# Patient Record
Sex: Male | Born: 1945 | Race: White | Hispanic: No | Marital: Married | Smoking: Former smoker
Health system: Southern US, Community
[De-identification: ages and names within clinical notes are randomized; demographics above are authoritative.]

## PROBLEM LIST (undated history)

## (undated) DIAGNOSIS — C61 Malignant neoplasm of prostate: Secondary | ICD-10-CM

## (undated) DIAGNOSIS — K219 Gastro-esophageal reflux disease without esophagitis: Secondary | ICD-10-CM

## (undated) DIAGNOSIS — M1990 Unspecified osteoarthritis, unspecified site: Secondary | ICD-10-CM

## (undated) DIAGNOSIS — J189 Pneumonia, unspecified organism: Secondary | ICD-10-CM

## (undated) HISTORY — PX: CHOLECYSTECTOMY: SHX55

## (undated) HISTORY — PX: HERNIA REPAIR: SHX51

---

## 2005-11-21 ENCOUNTER — Emergency Department (HOSPITAL_COMMUNITY): Admission: EM | Admit: 2005-11-21 | Discharge: 2005-11-21 | Payer: Self-pay | Admitting: Emergency Medicine

## 2005-11-24 ENCOUNTER — Emergency Department (HOSPITAL_COMMUNITY): Admission: EM | Admit: 2005-11-24 | Discharge: 2005-11-24 | Payer: Self-pay | Admitting: Emergency Medicine

## 2005-11-28 ENCOUNTER — Encounter (HOSPITAL_COMMUNITY): Admission: RE | Admit: 2005-11-28 | Discharge: 2006-02-26 | Payer: Self-pay | Admitting: Emergency Medicine

## 2014-02-17 ENCOUNTER — Other Ambulatory Visit: Payer: Self-pay | Admitting: Family Medicine

## 2014-02-17 DIAGNOSIS — Z87891 Personal history of nicotine dependence: Secondary | ICD-10-CM

## 2014-03-09 ENCOUNTER — Ambulatory Visit
Admission: RE | Admit: 2014-03-09 | Discharge: 2014-03-09 | Disposition: A | Discharge status: Home / Self Care | Payer: Medicare PPO | Source: Ambulatory Visit | Attending: Family Medicine | Admitting: Family Medicine

## 2014-03-09 DIAGNOSIS — Z87891 Personal history of nicotine dependence: Secondary | ICD-10-CM

## 2014-04-22 ENCOUNTER — Ambulatory Visit
Admission: RE | Admit: 2014-04-22 | Discharge: 2014-04-22 | Disposition: A | Discharge status: Home / Self Care | Payer: Medicare PPO | Source: Ambulatory Visit | Attending: Family Medicine | Admitting: Family Medicine

## 2014-04-22 ENCOUNTER — Other Ambulatory Visit: Payer: Self-pay | Admitting: Family Medicine

## 2014-04-22 DIAGNOSIS — M25551 Pain in right hip: Secondary | ICD-10-CM

## 2015-02-22 DIAGNOSIS — Z23 Encounter for immunization: Secondary | ICD-10-CM | POA: Diagnosis not present

## 2015-02-22 DIAGNOSIS — Z131 Encounter for screening for diabetes mellitus: Secondary | ICD-10-CM | POA: Diagnosis not present

## 2015-02-22 DIAGNOSIS — Z0000 Encounter for general adult medical examination without abnormal findings: Secondary | ICD-10-CM | POA: Diagnosis not present

## 2015-02-22 DIAGNOSIS — Z125 Encounter for screening for malignant neoplasm of prostate: Secondary | ICD-10-CM | POA: Diagnosis not present

## 2015-04-05 DIAGNOSIS — H52223 Regular astigmatism, bilateral: Secondary | ICD-10-CM | POA: Diagnosis not present

## 2015-04-05 DIAGNOSIS — H524 Presbyopia: Secondary | ICD-10-CM | POA: Diagnosis not present

## 2015-04-05 DIAGNOSIS — H2513 Age-related nuclear cataract, bilateral: Secondary | ICD-10-CM | POA: Diagnosis not present

## 2015-06-02 DIAGNOSIS — L821 Other seborrheic keratosis: Secondary | ICD-10-CM | POA: Diagnosis not present

## 2015-06-02 DIAGNOSIS — D2239 Melanocytic nevi of other parts of face: Secondary | ICD-10-CM | POA: Diagnosis not present

## 2015-06-02 DIAGNOSIS — D225 Melanocytic nevi of trunk: Secondary | ICD-10-CM | POA: Diagnosis not present

## 2015-06-02 DIAGNOSIS — Z872 Personal history of diseases of the skin and subcutaneous tissue: Secondary | ICD-10-CM | POA: Diagnosis not present

## 2015-06-02 DIAGNOSIS — L570 Actinic keratosis: Secondary | ICD-10-CM | POA: Diagnosis not present

## 2015-06-02 DIAGNOSIS — D1801 Hemangioma of skin and subcutaneous tissue: Secondary | ICD-10-CM | POA: Diagnosis not present

## 2015-06-02 DIAGNOSIS — L814 Other melanin hyperpigmentation: Secondary | ICD-10-CM | POA: Diagnosis not present

## 2016-09-25 IMAGING — CR DG HIP COMPLETE 2+V*R*
2 series · 2 of 2 positions shown · non-contrast
Comparison: None.

CLINICAL DATA: Right hip pain over the last month, no trauma, the
patient is a runner

EXAM:
RIGHT HIP - COMPLETE 2+ VIEW

[t hip ap right]
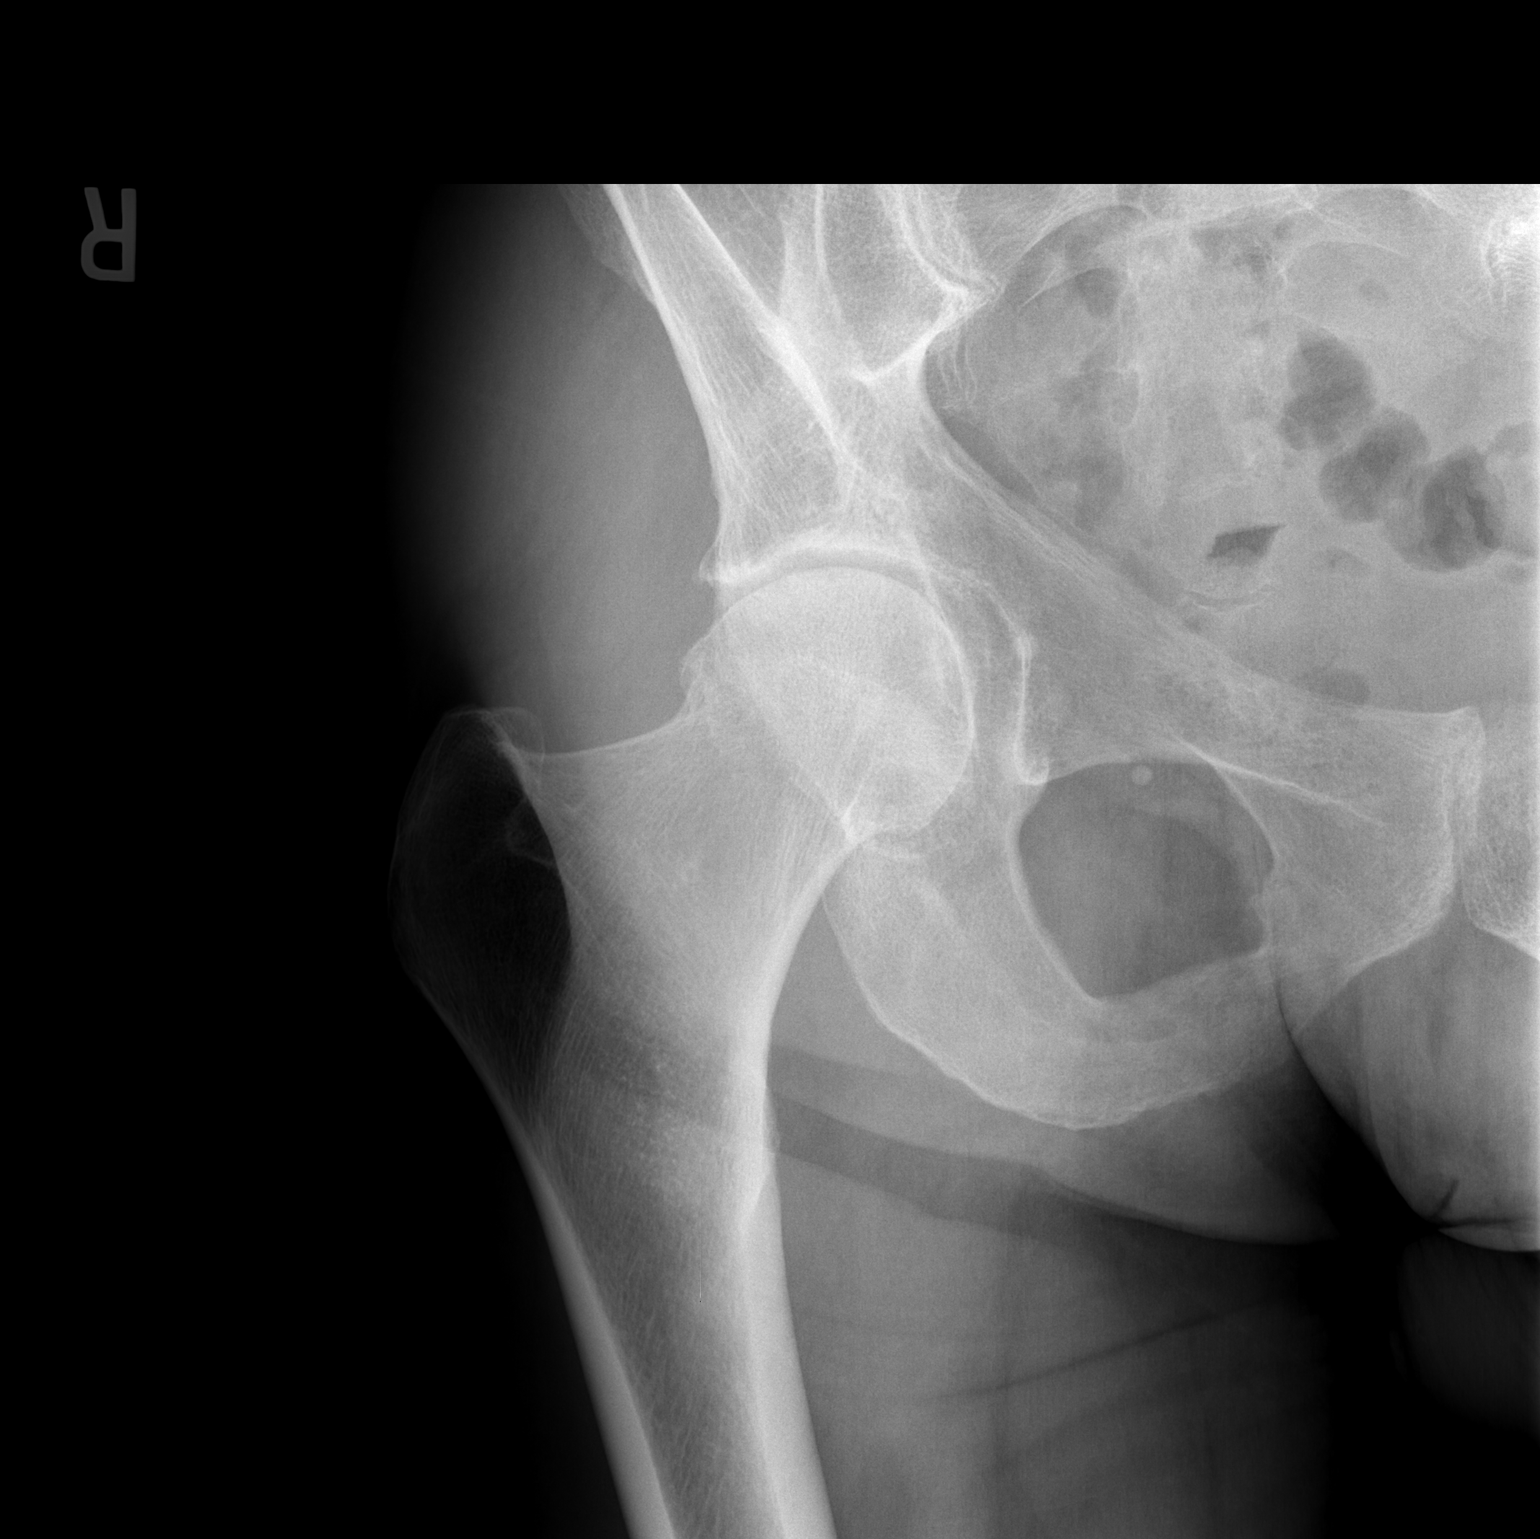

[t hip frog leg right]
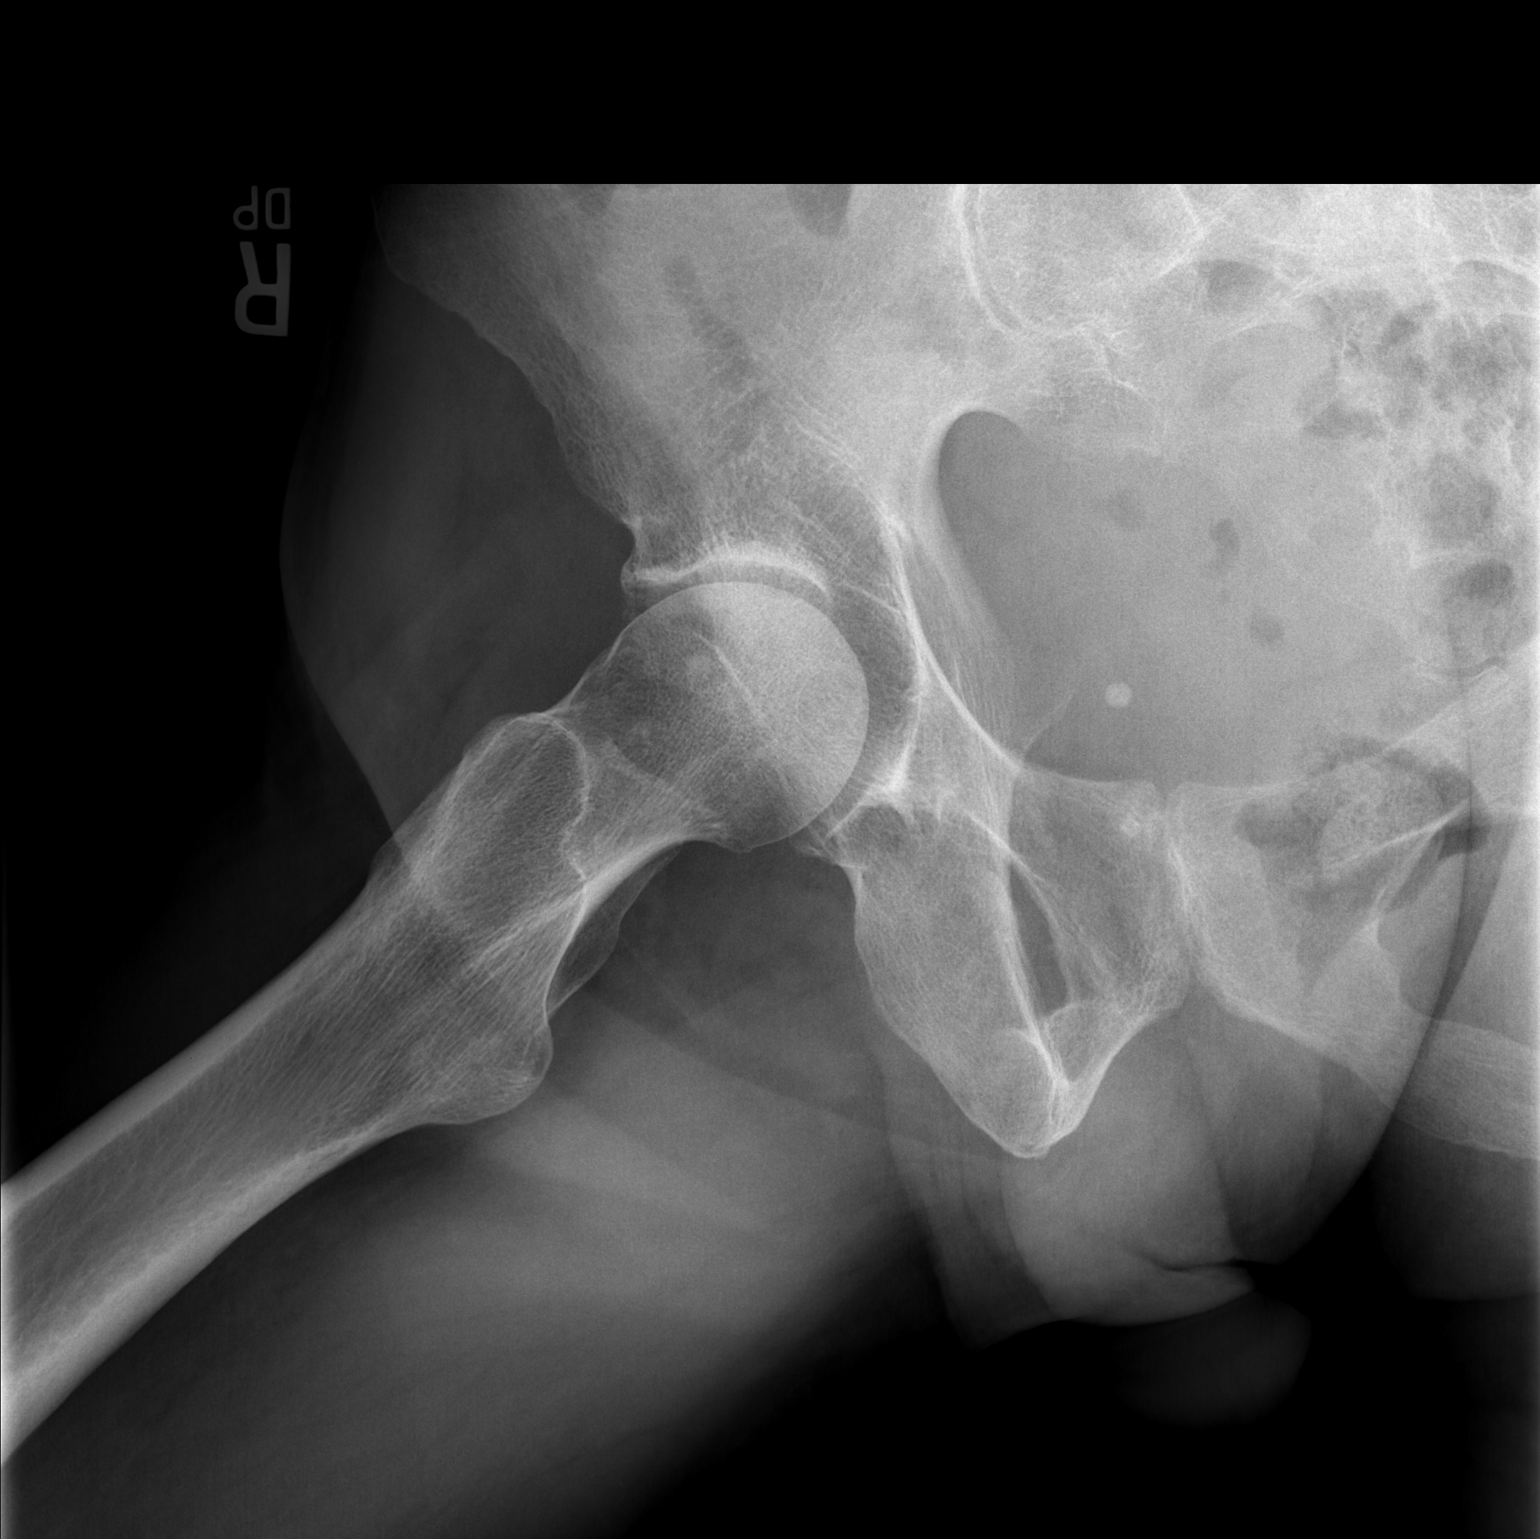

[2 of 2 positions shown; findings below may reference images not displayed]

FINDINGS: There is mild degenerative joint disease of the right hip with some
loss of joint space and sclerosis with minimal spurring. No acute
fracture is seen. The right pelvic ramus is intact. The right SI
joint appears corticated.
IMPRESSION: Mild degenerative joint disease of the right hip.

## 2017-12-18 ENCOUNTER — Other Ambulatory Visit: Payer: Self-pay | Admitting: Urology

## 2017-12-18 DIAGNOSIS — C61 Malignant neoplasm of prostate: Secondary | ICD-10-CM

## 2017-12-31 ENCOUNTER — Encounter (HOSPITAL_COMMUNITY)
Admission: RE | Admit: 2017-12-31 | Discharge: 2017-12-31 | Disposition: A | Discharge status: Home / Self Care | Payer: Medicare Other | Source: Ambulatory Visit | Attending: Urology | Admitting: Urology

## 2017-12-31 ENCOUNTER — Ambulatory Visit (HOSPITAL_COMMUNITY)
Admission: RE | Admit: 2017-12-31 | Discharge: 2017-12-31 | Disposition: A | Discharge status: Home / Self Care | Payer: Medicare Other | Source: Ambulatory Visit | Attending: Urology | Admitting: Urology

## 2017-12-31 ENCOUNTER — Encounter (HOSPITAL_COMMUNITY): Payer: Self-pay

## 2017-12-31 DIAGNOSIS — C61 Malignant neoplasm of prostate: Secondary | ICD-10-CM

## 2017-12-31 MED ORDER — FLUDEOXYGLUCOSE F - 18 (FDG) INJECTION
21.9000 | Freq: Once | INTRAVENOUS | Status: AC
  Administered 2017-12-31: 21.9 via INTRAVENOUS

## 2018-01-10 ENCOUNTER — Other Ambulatory Visit: Payer: Self-pay | Admitting: Urology

## 2018-02-18 NOTE — Patient Instructions (Addendum)
Casey Burke  02/18/2018   Your procedure is scheduled on: 02-27-18    Report to [facility] Main  Entrance    Report to admitting at 5:30AM    Call this number if you have problems the morning of surgery [phone]    Remember: Do not eat food After Midnight on Tuesday 02-25-18. Drink plenty of clear liquids (see menu below) all day Wednesday 02-26-18 and follow all bowel prep orders provided by your surgeon. Nothing by mouth after midnight on Wednesday !   PER YOUR SURGEONS ORDERS. YOU MUST DO A FLEETS ENEMA THE NIGHT BEFORE SURGERY!   CLEAR LIQUID DIET   Foods Allowed                                                                     Foods Excluded  Coffee and tea, regular and decaf                             liquids that you cannot  Plain Jell-O in any flavor                                             see through such as: Fruit ices (not with fruit pulp)                                     milk, soups, orange juice  Iced Popsicles                                    All solid food Carbonated beverages, regular and diet                                    Cranberry, grape and apple juices Sports drinks like Gatorade Lightly seasoned clear broth or consume(fat free) Sugar, honey syrup  Sample Menu Breakfast                                Lunch                                     Supper Cranberry juice                    Beef broth                            Chicken broth Jell-O                                     Grape  juice                           Apple juice Coffee or tea                        Jell-O                                      Popsicle                                                Coffee or tea                        Coffee or tea  ____________________________________________________________________       Take these medicines the morning of surgery with A SIP OF WATER:                                 You may not have any metal on  your body including hair pins and              piercings  Do not wear jewelry, make-up, lotions, powders or perfumes, deodorant                   Men may shave face and neck.   Do not bring valuables to the hospital. [place].  Contacts, dentures or bridgework may not be worn into surgery.  Leave suitcase in the car. After surgery it may be brought to your room.                  Please read over the following fact sheets you were given: _____________________________________________________________________             [facility] - Preparing for Surgery Before surgery, you can play an important role.  Because skin is not sterile, your skin needs to be as free of germs as possible.  You can reduce the number of germs on your skin by washing with CHG (chlorahexidine gluconate) soap before surgery.  CHG is an antiseptic cleaner which kills germs and bonds with the skin to continue killing germs even after washing. Please DO NOT use if you have an allergy to CHG or antibacterial soaps.  If your skin becomes reddened/irritated stop using the CHG and inform your nurse when you arrive at Short Stay. Do not shave (including legs and underarms) for at least 48 hours prior to the first CHG shower.  You may shave your face/neck. Please follow these instructions carefully:  1.  Shower with CHG Soap the night before surgery and the  morning of Surgery.  2.  If you choose to wash your hair, wash your hair first as usual with your  normal  shampoo.  3.  After you shampoo, rinse your hair and body thoroughly to remove the  shampoo.                           4.  Use CHG as you would any other liquid soap.  You can apply chg directly  to the skin and wash                       Gently with a scrungie or clean washcloth.  5.  Apply the CHG Soap to your body ONLY FROM THE NECK DOWN.   Do not use on face/ open                           Wound or open sores. Avoid  contact with eyes, ears mouth and genitals (private parts).                       Wash face,  Genitals (private parts) with your normal soap.             6.  Wash thoroughly, paying special attention to the area where your surgery  will be performed.  7.  Thoroughly rinse your body with warm water from the neck down.  8.  DO NOT shower/wash with your normal soap after using and rinsing off  the CHG Soap.                9.  Pat yourself dry with a clean towel.            10.  Wear clean pajamas.            11.  Place clean sheets on your bed the night of your first shower and do not  sleep with pets. Day of Surgery : Do not apply any lotions/deodorants the morning of surgery.  Please wear clean clothes to the hospital/surgery center.  FAILURE TO FOLLOW THESE INSTRUCTIONS MAY RESULT IN THE CANCELLATION OF YOUR SURGERY PATIENT SIGNATURE_________________________________  NURSE SIGNATURE__________________________________  ________________________________________________________________________   Casey Burke  An incentive spirometer is a tool that can help keep your lungs clear and active. This tool measures how well you are filling your lungs with each breath. Taking long deep breaths may help reverse or decrease the chance of developing breathing (pulmonary) problems (especially infection) following:  A long period of time when you are unable to move or be active. BEFORE THE PROCEDURE   If the spirometer includes an indicator to show your best effort, your nurse or respiratory therapist will set it to a desired goal.  If possible, sit up straight or lean slightly forward. Try not to slouch.  Hold the incentive spirometer in an upright position. INSTRUCTIONS FOR USE  1. Sit on the edge of your bed if possible, or sit up as far as you can in bed or on a chair. 2. Hold the incentive spirometer in an upright position. 3. Breathe out normally. 4. Place the mouthpiece in your mouth  and seal your lips tightly around it. 5. Breathe in slowly and as deeply as possible, raising the piston or the ball toward the top of the column. 6. Hold your breath for 3-5 seconds or for as long as possible. Allow the piston or ball to fall to the bottom of the column. 7. Remove the mouthpiece from your mouth and breathe out normally. 8. Rest for a few seconds and repeat Steps 1 through 7 at least 10 times every 1-2 hours when you are awake. Take your time and take a few normal breaths between deep breaths. 9. The spirometer may include an indicator to show your  best effort. Use the indicator as a goal to work toward during each repetition. 10. After each set of 10 deep breaths, practice coughing to be sure your lungs are clear. If you have an incision (the cut made at the time of surgery), support your incision when coughing by placing a pillow or rolled up towels firmly against it. Once you are able to get out of bed, walk around indoors and cough well. You may stop using the incentive spirometer when instructed by your caregiver.  RISKS AND COMPLICATIONS  Take your time so you do not get dizzy or light-headed.  If you are in pain, you may need to take or ask for pain medication before doing incentive spirometry. It is harder to take a deep breath if you are having pain. AFTER USE  Rest and breathe slowly and easily.  It can be helpful to keep track of a log of your progress. Your caregiver can provide you with a simple table to help with this. If you are using the spirometer at home, follow these instructions: [place] IF:   You are having difficultly using the spirometer.  You have trouble using the spirometer as often as instructed.  Your pain medication is not giving enough relief while using the spirometer.  You develop fever of 100.5 F (38.1 C) or higher. SEEK IMMEDIATE MEDICAL CARE IF:   You cough up bloody sputum that had not been present before.  You develop  fever of 102 F (38.9 C) or greater.  You develop worsening pain at or near the incision site. MAKE SURE YOU:   Understand these instructions.  Will watch your condition.  Will get help right away if you are not doing well or get worse. Document Released: 10/22/2006 Document Revised: 09/03/2011 Document Reviewed: 12/23/2006 Exit[employer] Patient Information 2014 [employer], [place].   ________________________________________________________________________  WHAT IS A BLOOD TRANSFUSION? Blood Transfusion Information  A transfusion is the replacement of blood or some of its parts. Blood is made up of multiple cells which provide different functions.  Red blood cells carry oxygen and are used for blood loss replacement.  White blood cells fight against infection.  Platelets control bleeding.  Plasma helps clot blood.  Other blood products are available for specialized needs, such as hemophilia or other clotting disorders. BEFORE THE TRANSFUSION  Who gives blood for transfusions?   Healthy volunteers who are fully evaluated to make sure their blood is safe. This is blood bank blood. Transfusion therapy is the safest it has ever been in the practice of medicine. Before blood is taken from a donor, a complete history is taken to make sure that person has no history of diseases nor engages in risky social behavior (examples are intravenous drug use or sexual activity with multiple partners). The donor's travel history is screened to minimize risk of transmitting infections, such as malaria. The donated blood is tested for signs of infectious diseases, such as HIV and hepatitis. The blood is then tested to be sure it is compatible with you in order to minimize the chance of a transfusion reaction. If you or a relative donates blood, this is often done in anticipation of surgery and is not appropriate for emergency situations. It takes many days to process the donated blood. RISKS AND  COMPLICATIONS Although transfusion therapy is very safe and saves many lives, the main dangers of transfusion include:   Getting an infectious disease.  Developing a transfusion reaction. This is an allergic reaction to something  in the blood you were given. Every precaution is taken to prevent this. The decision to have a blood transfusion has been considered carefully by your caregiver before blood is given. Blood is not given unless the benefits outweigh the risks. AFTER THE TRANSFUSION  Right after receiving a blood transfusion, you will usually feel much better and more energetic. This is especially true if your red blood cells have gotten low (anemic). The transfusion raises the level of the red blood cells which carry oxygen, and this usually causes an energy increase.  The nurse administering the transfusion will monitor you carefully for complications. HOME CARE INSTRUCTIONS  No special instructions are needed after a transfusion. You may find your energy is better. Speak with your caregiver about any limitations on activity for underlying diseases you may have. SEEK MEDICAL CARE IF:   Your condition is not improving after your transfusion.  You develop redness or irritation at the intravenous (IV) site. SEEK IMMEDIATE MEDICAL CARE IF:  Any of the following symptoms occur over the next 12 hours:  Shaking chills.  You have a temperature by mouth above 102 F (38.9 C), not controlled by medicine.  Chest, back, or muscle pain.  People around you feel you are not acting correctly or are confused.  Shortness of breath or difficulty breathing.  Dizziness and fainting.  You get a rash or develop hives.  You have a decrease in urine output.  Your urine turns a dark color or changes to pink, red, or brown. Any of the following symptoms occur over the next 10 days:  You have a temperature by mouth above 102 F (38.9 C), not controlled by medicine.  Shortness of  breath.  Weakness after normal activity.  The white part of the eye turns yellow (jaundice).  You have a decrease in the amount of urine or are urinating less often.  Your urine turns a dark color or changes to pink, red, or brown. Document Released: 06/08/2000 Document Revised: 09/03/2011 Document Reviewed: 01/26/2008 [facility] Patient Information 2014 [place], [place].  _______________________________________________________________________

## 2018-02-20 ENCOUNTER — Other Ambulatory Visit: Payer: Self-pay

## 2018-02-20 ENCOUNTER — Encounter (HOSPITAL_COMMUNITY)
Admission: RE | Admit: 2018-02-20 | Discharge: 2018-02-20 | Disposition: A | Discharge status: Home / Self Care | Payer: Medicare Other | Source: Ambulatory Visit | Attending: Urology | Admitting: Urology

## 2018-02-20 ENCOUNTER — Encounter (HOSPITAL_COMMUNITY): Payer: Self-pay

## 2018-02-20 DIAGNOSIS — Z01818 Encounter for other preprocedural examination: Secondary | ICD-10-CM | POA: Insufficient documentation

## 2018-02-20 DIAGNOSIS — C61 Malignant neoplasm of prostate: Secondary | ICD-10-CM | POA: Insufficient documentation

## 2018-02-20 HISTORY — DX: Gastro-esophageal reflux disease without esophagitis: K21.9

## 2018-02-20 HISTORY — DX: Pneumonia, unspecified organism: J18.9

## 2018-02-20 HISTORY — DX: Unspecified osteoarthritis, unspecified site: M19.90

## 2018-02-20 HISTORY — DX: Malignant neoplasm of prostate: C61

## 2018-02-20 LAB — BASIC METABOLIC PANEL
Anion gap: 5 (ref 5–15)
BUN: 15 mg/dL (ref 8–23)
CO2: 30 mmol/L (ref 22–32)
Calcium: 9.1 mg/dL (ref 8.9–10.3)
Chloride: 101 mmol/L (ref 98–111)
Creatinine, Ser: 0.86 mg/dL (ref 0.61–1.24)
GFR calc Af Amer: >60 mL/min (ref >60)
GLUCOSE: 90 mg/dL (ref 70–99)
Potassium: 4.4 mmol/L (ref 3.5–5.1)
Sodium: 136 mmol/L (ref 135–145)

## 2018-02-20 LAB — CBC
HEMATOCRIT: 43.9 % (ref 39.0–52.0)
Hemoglobin: 15.2 g/dL (ref 13.0–17.0)
MCH: 32.5 pg (ref 26.0–34.0)
MCHC: 34.6 g/dL (ref 30.0–36.0)
MCV: 93.8 fL (ref 78.0–100.0)
Platelets: 229 10*3/uL (ref 150–400)
RBC: 4.68 MIL/uL (ref 4.22–5.81)
RDW: 13.3 % (ref 11.5–15.5)
WBC: 4.5 10*3/uL (ref 4.0–10.5)

## 2018-02-20 LAB — ABO/RH: ABO/RH(D): B POS

## 2018-02-26 NOTE — H&P (Signed)
CC/HPI: CC: Prostate Cancer    Casey Burke is a 72 year old gentleman who was noted to have an elevated PSA of 3.03 and abnormal digital rectal exam with nodularity of the right mid/apex. He underwent a TRUS biopsy by Dr. 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   Family history: Father (diagnosed in early 82s and treated with XRT)   Imaging studies:  CT pelvis (12/31/17): Negative for metastatic disease.  Bone scan (12/31/17): Negative for metastatic disease.   PMH: He has a history of GERD.  PSH: Laparoscopic cholecystectomy.   TNM stage: cT2b N0 M0 (R mid/apex - confined)  PSA: 3.03  Gleason score: 4+3=7  Biopsy (12/10/17): 8/12 cores positive  Left: L lateral apex (50%, 3+4=7), L apex (30%, 4+3=7), L lateral mid (90%, 4+3=7, PNI), L lateral base (40%, 3+4=7)  Right: R apex (40%, 4+3=7, PNI), R lateral apex (20%, 3+4=7), R mid (40%, 4+3=7), R lateral mid (10%, 4+3=7, PNI)  Prostate volume: 19.0 cc   Nomogram  OC disease: 24%  EPE: 74%  SVI: 19%  LNI: 19%  PFS (5 year, 10 year): 59%, 43%   Urinary function: IPSS is 2.  Erectile function: SHIM score is 9. He can obtain reliable erections with sildenafil 100 mg.     ALLERGIES: None   MEDICATIONS: Glucosamine 1,000 mg tablet  Ibuprofen  Multivitamin  Pepcid     GU PSH: Locm 300-399Mg /Ml Iodine,1Ml - 12/31/2017 Prostate Needle Biopsy - 12/10/2017    NON-GU PSH: Remove Gallbladder Surgical Pathology, Gross And Microscopic Examination For Prostate Needle - 12/10/2017    GU PMH: Prostate Cancer - 12/18/2017 Elevated PSA - 12/10/2017, - 10/25/2017 Acute prostatitis - 05/10/2017 BPH w/LUTS - 05/10/2017 Nocturia - 05/10/2017    NON-GU PMH: Arthritis    FAMILY HISTORY: 1 Daughter - Daughter 3 Son's - Son   SOCIAL HISTORY: Marital Status: Married Preferred Language: English; Race: White Current Smoking Status: Patient has never smoked.    Tobacco Use Assessment Completed: Used Tobacco in last 30 days? Does drink.  Drinks 4+ caffeinated drinks per day.    REVIEW OF SYSTEMS:    GU Review Male:   Patient denies frequent urination, hard to postpone urination, burning/ pain with urination, get up at night to urinate, leakage of urine, stream starts and stops, trouble starting your streams, and have to strain to urinate .  Gastrointestinal (Lower):   Patient denies diarrhea and constipation.  Gastrointestinal (Upper):   Patient denies nausea and vomiting.  Constitutional:   Patient denies fever, night sweats, weight loss, and fatigue.  Skin:   Patient denies skin rash/ lesion and itching.  Eyes:   Patient denies blurred vision and double vision.  Ears/ Nose/ Throat:   Patient denies sore throat and sinus problems.  Hematologic/Lymphatic:   Patient denies swollen glands and easy bruising.  Cardiovascular:   Patient denies chest pains and leg swelling.  Respiratory:   Patient denies cough and shortness of breath.  Endocrine:   Patient denies excessive thirst.  Musculoskeletal:   Patient denies back pain and joint pain.  Neurological:   Patient denies headaches and dizziness.  Psychologic:   Patient denies depression and anxiety.   VITAL SIGNS:     Weight 150 lb / 68.04 kg  Height 74 in / 187.96 cm  BMI 19.3 kg/m   MULTI-SYSTEM PHYSICAL EXAMINATION:    Constitutional: Well-nourished. No physical deformities. Normally developed. Good grooming.  Neck:  Neck symmetrical, not swollen. Normal tracheal position.  Respiratory: No labored breathing, no use of accessory muscles. Normal breath sounds. Clear bilaterally.  Cardiovascular: Regular rate and rhythm. Normal temperature, normal extremity pulses, no swelling, no varicosities.   Lymphatic: No enlargement of neck, axillae, groin.  Skin: No paleness, no jaundice, no cyanosis. No lesion, no ulcer, no rash.  Neurologic / Psychiatric: Oriented to time, oriented to place,  oriented to person. No depression, no anxiety, no agitation.  Gastrointestinal: No mass, no tenderness, no rigidity, non obese abdomen.  Eyes: Normal conjunctivae. Normal eyelids.  Ears, Nose, Mouth, and Throat: Left ear no scars, no lesions, no masses. Right ear no scars, no lesions, no masses. Nose no scars, no lesions, no masses. Normal hearing. Normal lips.  Musculoskeletal: Normal gait and station of head and neck.        ASSESSMENT:      ICD-10 Details  1 GU:   Prostate Cancer - C61    PLAN:        1. Prostate cancer: He will be scheduled for a unilateral left nerve-sparing robot assisted laparoscopic radical prostatectomy and bilateral pelvic lymphadenectomy.

## 2018-02-27 ENCOUNTER — Ambulatory Visit (HOSPITAL_COMMUNITY): Payer: Medicare Other | Admitting: Anesthesiology

## 2018-02-27 ENCOUNTER — Encounter (HOSPITAL_COMMUNITY): Admission: RE | Disposition: A | Discharge status: Home / Self Care | Payer: Self-pay | Attending: Urology

## 2018-02-27 ENCOUNTER — Observation Stay (HOSPITAL_COMMUNITY)
Admission: RE | Admit: 2018-02-27 | Discharge: 2018-02-28 | Disposition: A | Discharge status: Home / Self Care | Payer: Medicare Other | Source: Other Acute Inpatient Hospital | Attending: Urology | Admitting: Urology

## 2018-02-27 ENCOUNTER — Other Ambulatory Visit: Payer: Self-pay

## 2018-02-27 ENCOUNTER — Encounter (HOSPITAL_COMMUNITY): Payer: Self-pay | Admitting: Emergency Medicine

## 2018-02-27 DIAGNOSIS — Z87891 Personal history of nicotine dependence: Secondary | ICD-10-CM | POA: Diagnosis not present

## 2018-02-27 DIAGNOSIS — M1990 Unspecified osteoarthritis, unspecified site: Secondary | ICD-10-CM | POA: Diagnosis not present

## 2018-02-27 DIAGNOSIS — C61 Malignant neoplasm of prostate: Secondary | ICD-10-CM | POA: Diagnosis present

## 2018-02-27 DIAGNOSIS — K219 Gastro-esophageal reflux disease without esophagitis: Secondary | ICD-10-CM | POA: Diagnosis not present

## 2018-02-27 HISTORY — PX: LYMPHADENECTOMY: SHX5960

## 2018-02-27 HISTORY — PX: ROBOT ASSISTED LAPAROSCOPIC RADICAL PROSTATECTOMY: SHX5141

## 2018-02-27 LAB — TYPE AND SCREEN
ABO/RH(D): B POS
ANTIBODY SCREEN: NEGATIVE

## 2018-02-27 LAB — HEMOGLOBIN AND HEMATOCRIT, BLOOD
HCT: 37.3 % — ABNORMAL LOW (ref 39.0–52.0)
HEMOGLOBIN: 13.2 g/dL (ref 13.0–17.0)

## 2018-02-27 SURGERY — XI ROBOTIC ASSISTED LAPAROSCOPIC RADICAL PROSTATECTOMY LEVEL 2
Anesthesia: General

## 2018-02-27 MED ORDER — CEFAZOLIN SODIUM-DEXTROSE 1-4 GM/50ML-% IV SOLN
1.0000 g | Freq: Three times a day (TID) | INTRAVENOUS | Status: AC
  Administered 2018-02-27 – 2018-02-28 (×2): 1 g via INTRAVENOUS
  Filled 2018-02-27 (×2): qty 50

## 2018-02-27 MED ORDER — KETOROLAC TROMETHAMINE 15 MG/ML IJ SOLN
15.0000 mg | Freq: Four times a day (QID) | INTRAMUSCULAR | Status: DC
  Administered 2018-02-27 – 2018-02-28 (×3): 15 mg via INTRAVENOUS
  Filled 2018-02-27 (×3): qty 1

## 2018-02-27 MED ORDER — SODIUM CHLORIDE 0.9 % IV BOLUS
1000.0000 mL | Freq: Once | INTRAVENOUS | Status: AC
  Administered 2018-02-27: 1000 mL via INTRAVENOUS

## 2018-02-27 MED ORDER — PROPOFOL 10 MG/ML IV BOLUS
INTRAVENOUS | Status: DC | PRN
  Administered 2018-02-27: 50 mg via INTRAVENOUS
  Administered 2018-02-27: 150 mg via INTRAVENOUS

## 2018-02-27 MED ORDER — GLYCOPYRROLATE PF 0.2 MG/ML IJ SOSY
PREFILLED_SYRINGE | INTRAMUSCULAR | Status: AC
  Filled 2018-02-27: qty 1

## 2018-02-27 MED ORDER — DIPHENHYDRAMINE HCL 12.5 MG/5ML PO ELIX: 12.5000 mg | ORAL_SOLUTION | Freq: Four times a day (QID) | ORAL | Status: DC | PRN

## 2018-02-27 MED ORDER — ROCURONIUM BROMIDE 10 MG/ML (PF) SYRINGE
PREFILLED_SYRINGE | INTRAVENOUS | Status: DC | PRN
  Administered 2018-02-27: 10 mg via INTRAVENOUS
  Administered 2018-02-27 (×2): 20 mg via INTRAVENOUS
  Administered 2018-02-27: 50 mg via INTRAVENOUS

## 2018-02-27 MED ORDER — BUPIVACAINE-EPINEPHRINE 0.5% -1:200000 IJ SOLN
INTRAMUSCULAR | Status: DC | PRN
  Administered 2018-02-27: 30 mL

## 2018-02-27 MED ORDER — PROPOFOL 10 MG/ML IV BOLUS
INTRAVENOUS | Status: AC
  Filled 2018-02-27: qty 20

## 2018-02-27 MED ORDER — SUGAMMADEX SODIUM 200 MG/2ML IV SOLN
INTRAVENOUS | Status: AC
  Filled 2018-02-27: qty 2

## 2018-02-27 MED ORDER — HEPARIN SODIUM (PORCINE) 1000 UNIT/ML IJ SOLN
INTRAMUSCULAR | Status: AC
  Filled 2018-02-27: qty 1

## 2018-02-27 MED ORDER — SODIUM CHLORIDE 0.9 % IR SOLN
Status: DC | PRN
  Administered 2018-02-27: 1000 mL

## 2018-02-27 MED ORDER — FAMOTIDINE 20 MG PO TABS: 40.0000 mg | ORAL_TABLET | Freq: Every day | ORAL | Status: DC | PRN

## 2018-02-27 MED ORDER — LIDOCAINE 2% (20 MG/ML) 5 ML SYRINGE
INTRAMUSCULAR | Status: AC
  Filled 2018-02-27: qty 5

## 2018-02-27 MED ORDER — ROCURONIUM BROMIDE 10 MG/ML (PF) SYRINGE
PREFILLED_SYRINGE | INTRAVENOUS | Status: AC
  Filled 2018-02-27: qty 10

## 2018-02-27 MED ORDER — DIPHENHYDRAMINE HCL 50 MG/ML IJ SOLN: 12.5000 mg | Freq: Four times a day (QID) | INTRAMUSCULAR | Status: DC | PRN

## 2018-02-27 MED ORDER — FENTANYL CITRATE (PF) 100 MCG/2ML IJ SOLN
INTRAMUSCULAR | Status: DC | PRN
  Administered 2018-02-27: 100 ug via INTRAVENOUS
  Administered 2018-02-27 (×3): 50 ug via INTRAVENOUS

## 2018-02-27 MED ORDER — ONDANSETRON HCL 4 MG/2ML IJ SOLN: 4.0000 mg | INTRAMUSCULAR | Status: DC | PRN

## 2018-02-27 MED ORDER — SULFAMETHOXAZOLE-TRIMETHOPRIM 800-160 MG PO TABS: 1.0000 | ORAL_TABLET | Freq: Two times a day (BID) | ORAL | 0 refills | Status: AC

## 2018-02-27 MED ORDER — ACETAMINOPHEN 325 MG PO TABS: 650.0000 mg | ORAL_TABLET | ORAL | Status: DC | PRN

## 2018-02-27 MED ORDER — PROMETHAZINE HCL 25 MG/ML IJ SOLN: 6.2500 mg | INTRAMUSCULAR | Status: DC | PRN

## 2018-02-27 MED ORDER — HYDROMORPHONE HCL 1 MG/ML IJ SOLN
INTRAMUSCULAR | Status: AC
  Filled 2018-02-27: qty 2

## 2018-02-27 MED ORDER — EPHEDRINE SULFATE 50 MG/ML IJ SOLN
INTRAMUSCULAR | Status: DC | PRN
  Administered 2018-02-27 (×2): 5 mg via INTRAVENOUS

## 2018-02-27 MED ORDER — HYDROMORPHONE HCL 2 MG/ML IJ SOLN
INTRAMUSCULAR | Status: AC
  Filled 2018-02-27: qty 1

## 2018-02-27 MED ORDER — LACTATED RINGERS IV SOLN
INTRAVENOUS | Status: DC
  Administered 2018-02-27 (×2): via INTRAVENOUS

## 2018-02-27 MED ORDER — LACTATED RINGERS IV SOLN
INTRAVENOUS | Status: DC | PRN
  Administered 2018-02-27: 1000 mL

## 2018-02-27 MED ORDER — ONDANSETRON HCL 4 MG/2ML IJ SOLN
INTRAMUSCULAR | Status: AC
  Filled 2018-02-27: qty 2

## 2018-02-27 MED ORDER — MAGNESIUM CITRATE PO SOLN: 1.0000 | Freq: Once | ORAL | Status: DC

## 2018-02-27 MED ORDER — OXYCODONE HCL 5 MG PO TABS: 5.0000 mg | ORAL_TABLET | Freq: Once | ORAL | Status: DC | PRN

## 2018-02-27 MED ORDER — DEXAMETHASONE SODIUM PHOSPHATE 10 MG/ML IJ SOLN
INTRAMUSCULAR | Status: AC
  Filled 2018-02-27: qty 1

## 2018-02-27 MED ORDER — TRAMADOL HCL 50 MG PO TABS: 50.0000 mg | ORAL_TABLET | Freq: Four times a day (QID) | ORAL | 0 refills | Status: AC | PRN

## 2018-02-27 MED ORDER — GLYCOPYRROLATE PF 0.2 MG/ML IJ SOSY
PREFILLED_SYRINGE | INTRAMUSCULAR | Status: DC | PRN
  Administered 2018-02-27: .2 mg via INTRAVENOUS

## 2018-02-27 MED ORDER — KCL IN DEXTROSE-NACL 20-5-0.45 MEQ/L-%-% IV SOLN
INTRAVENOUS | Status: DC
  Administered 2018-02-27 – 2018-02-28 (×3): via INTRAVENOUS
  Filled 2018-02-27 (×3): qty 1000

## 2018-02-27 MED ORDER — BUPIVACAINE-EPINEPHRINE (PF) 0.5% -1:200000 IJ SOLN
INTRAMUSCULAR | Status: AC
  Filled 2018-02-27: qty 30

## 2018-02-27 MED ORDER — DEXAMETHASONE SODIUM PHOSPHATE 10 MG/ML IJ SOLN
INTRAMUSCULAR | Status: DC | PRN
  Administered 2018-02-27: 10 mg via INTRAVENOUS

## 2018-02-27 MED ORDER — SUGAMMADEX SODIUM 200 MG/2ML IV SOLN
INTRAVENOUS | Status: DC | PRN
  Administered 2018-02-27: 150 mg via INTRAVENOUS

## 2018-02-27 MED ORDER — MORPHINE SULFATE (PF) 2 MG/ML IV SOLN: 2.0000 mg | INTRAVENOUS | Status: DC | PRN

## 2018-02-27 MED ORDER — HYDROMORPHONE HCL 1 MG/ML IJ SOLN: 0.2500 mg | INTRAMUSCULAR | Status: DC | PRN

## 2018-02-27 MED ORDER — FLEET ENEMA 7-19 GM/118ML RE ENEM: 1.0000 | ENEMA | Freq: Once | RECTAL | Status: DC

## 2018-02-27 MED ORDER — OXYCODONE HCL 5 MG/5ML PO SOLN
5.0000 mg | Freq: Once | ORAL | Status: DC | PRN
  Filled 2018-02-27: qty 5

## 2018-02-27 MED ORDER — DOCUSATE SODIUM 100 MG PO CAPS
100.0000 mg | ORAL_CAPSULE | Freq: Two times a day (BID) | ORAL | Status: DC
  Administered 2018-02-27 – 2018-02-28 (×2): 100 mg via ORAL
  Filled 2018-02-27 (×2): qty 1

## 2018-02-27 MED ORDER — BACITRACIN-NEOMYCIN-POLYMYXIN 400-5-5000 EX OINT: 1.0000 "application " | TOPICAL_OINTMENT | Freq: Three times a day (TID) | CUTANEOUS | Status: DC | PRN

## 2018-02-27 MED ORDER — EPHEDRINE 5 MG/ML INJ
INTRAVENOUS | Status: AC
  Filled 2018-02-27: qty 10

## 2018-02-27 MED ORDER — LIDOCAINE 2% (20 MG/ML) 5 ML SYRINGE
INTRAMUSCULAR | Status: DC | PRN
  Administered 2018-02-27: 50 mg via INTRAVENOUS

## 2018-02-27 MED ORDER — FENTANYL CITRATE (PF) 250 MCG/5ML IJ SOLN
INTRAMUSCULAR | Status: AC
  Filled 2018-02-27: qty 5

## 2018-02-27 MED ORDER — HYDROMORPHONE HCL 1 MG/ML IJ SOLN
INTRAMUSCULAR | Status: DC | PRN
  Administered 2018-02-27 (×4): 0.5 mg via INTRAVENOUS

## 2018-02-27 MED ORDER — CEFAZOLIN SODIUM-DEXTROSE 2-4 GM/100ML-% IV SOLN
2.0000 g | Freq: Once | INTRAVENOUS | Status: AC
  Administered 2018-02-27: 2 g via INTRAVENOUS
  Filled 2018-02-27: qty 100

## 2018-02-27 MED ORDER — MEPERIDINE HCL 50 MG/ML IJ SOLN: 6.2500 mg | INTRAMUSCULAR | Status: DC | PRN

## 2018-02-27 SURGICAL SUPPLY — 54 items
APPLICATOR COTTON TIP 6 STRL (MISCELLANEOUS) ×2 IMPLANT
APPLICATOR COTTON TIP 6IN STRL (MISCELLANEOUS) ×4
CATH FOLEY 2WAY SLVR 18FR 30CC (CATHETERS) ×4 IMPLANT
CATH ROBINSON RED A/P 16FR (CATHETERS) ×4 IMPLANT
CATH ROBINSON RED A/P 8FR (CATHETERS) ×4 IMPLANT
CATH TIEMANN FOLEY 18FR 5CC (CATHETERS) ×4 IMPLANT
CHLORAPREP W/TINT 26ML (MISCELLANEOUS) ×4 IMPLANT
CLIP VESOLOCK LG 6/CT PURPLE (CLIP) ×8 IMPLANT
COVER SURGICAL LIGHT HANDLE (MISCELLANEOUS) ×4 IMPLANT
COVER TIP SHEARS 8 DVNC (MISCELLANEOUS) ×2 IMPLANT
COVER TIP SHEARS 8MM DA VINCI (MISCELLANEOUS) ×2
CUTTER ECHEON FLEX ENDO 45 340 (ENDOMECHANICALS) ×4 IMPLANT
DECANTER SPIKE VIAL GLASS SM (MISCELLANEOUS) ×4 IMPLANT
DERMABOND ADVANCED (GAUZE/BANDAGES/DRESSINGS) ×2
DERMABOND ADVANCED .7 DNX12 (GAUZE/BANDAGES/DRESSINGS) ×2 IMPLANT
DRAPE ARM DVNC X/XI (DISPOSABLE) ×8 IMPLANT
DRAPE COLUMN DVNC XI (DISPOSABLE) ×2 IMPLANT
DRAPE DA VINCI XI ARM (DISPOSABLE) ×8
DRAPE DA VINCI XI COLUMN (DISPOSABLE) ×2
DRAPE SURG IRRIG POUCH 19X23 (DRAPES) ×4 IMPLANT
DRSG TEGADERM 4X4.75 (GAUZE/BANDAGES/DRESSINGS) ×4 IMPLANT
ELECT REM PT RETURN 15FT ADLT (MISCELLANEOUS) ×4 IMPLANT
GLOVE BIO SURGEON STRL SZ 6.5 (GLOVE) ×3 IMPLANT
GLOVE BIO SURGEONS STRL SZ 6.5 (GLOVE) ×1
GLOVE BIOGEL M STRL SZ7.5 (GLOVE) ×8 IMPLANT
GOWN STRL REUS W/TWL LRG LVL3 (GOWN DISPOSABLE) ×12 IMPLANT
HOLDER FOLEY CATH W/STRAP (MISCELLANEOUS) ×4 IMPLANT
IRRIG SUCT STRYKERFLOW 2 WTIP (MISCELLANEOUS) ×4
IRRIGATION SUCT STRKRFLW 2 WTP (MISCELLANEOUS) ×2 IMPLANT
IV LACTATED RINGERS 1000ML (IV SOLUTION) IMPLANT
NDL SAFETY ECLIPSE 18X1.5 (NEEDLE) ×2 IMPLANT
NEEDLE HYPO 18GX1.5 SHARP (NEEDLE) ×2
PACK ROBOT UROLOGY CUSTOM (CUSTOM PROCEDURE TRAY) ×4 IMPLANT
SEAL CANN UNIV 5-8 DVNC XI (MISCELLANEOUS) ×8 IMPLANT
SEAL XI 5MM-8MM UNIVERSAL (MISCELLANEOUS) ×8
SOLUTION ELECTROLUBE (MISCELLANEOUS) ×4 IMPLANT
STAPLE RELOAD 45 GRN (STAPLE) ×2 IMPLANT
STAPLE RELOAD 45MM GREEN (STAPLE) ×2
SUT ETHILON 3 0 PS 1 (SUTURE) ×4 IMPLANT
SUT MNCRL 3 0 RB1 (SUTURE) ×2 IMPLANT
SUT MNCRL 3 0 VIOLET RB1 (SUTURE) ×2 IMPLANT
SUT MNCRL AB 4-0 PS2 18 (SUTURE) ×8 IMPLANT
SUT MONOCRYL 3 0 RB1 (SUTURE) ×4
SUT VIC AB 0 CT1 27 (SUTURE) ×2
SUT VIC AB 0 CT1 27XBRD ANTBC (SUTURE) ×2 IMPLANT
SUT VIC AB 0 UR5 27 (SUTURE) ×4 IMPLANT
SUT VIC AB 2-0 SH 27 (SUTURE) ×2
SUT VIC AB 2-0 SH 27X BRD (SUTURE) ×2 IMPLANT
SUT VICRYL 0 UR6 27IN ABS (SUTURE) ×8 IMPLANT
SYR 27GX1/2 1ML LL SAFETY (SYRINGE) ×4 IMPLANT
TOWEL OR 17X26 10 PK STRL BLUE (TOWEL DISPOSABLE) ×4 IMPLANT
TOWEL OR NON WOVEN STRL DISP B (DISPOSABLE) ×4 IMPLANT
TUBING INSUFFLATION 10FT LAP (TUBING) IMPLANT
WATER STERILE IRR 1000ML POUR (IV SOLUTION) IMPLANT

## 2018-02-27 NOTE — Progress Notes (Signed)
Post-op note  Subjective: The patient is doing well.  No complaints.  Denies N/V  Objective: Vital signs in last 24 hours: Temp:  [96.1 F (35.6 C)-98.3 F (36.8 C)] 97.5 F (36.4 C) (09/05 1325) Pulse Rate:  [51-70] 70 (09/05 1325) Resp:  [6-18] 16 (09/05 1325) BP: (103-146)/(46-72) 110/46 (09/05 1325) SpO2:  [98 %-100 %] 98 % (09/05 1325) Weight:  [68 kg] 68 kg (09/05 0546)  Intake/Output from previous day: No intake/output data recorded. Intake/Output this shift: Total I/O In: 3000 [I.V.:1900; IV Piggyback:1100] Out: 485 [Urine:250; Drains:85; Blood:150]  Physical Exam:  General: Alert and oriented. Abdomen: Soft, Nondistended. Incisions: Clean and dry. Urine: red  Lab Results: Recent Labs    02/27/18 1128  HGB 13.2  HCT 37.3*    Assessment/Plan: POD#0   1) Continue to monitor  2) DVT prophy, clears, IS, amb, pain control   LOS: 0 days   Debbrah Alar 02/27/2018, 3:06 PM

## 2018-02-27 NOTE — Op Note (Signed)
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 Robotic assisted laparoscopic radical prostatectomy (left nerve sparing) 2. Bilateral robotic assisted laparoscopic pelvic lymphadenectomy  Surgeon: Pryor Curia. M.D.  Assistant(s): Debbrah Alar, PA-C  An assistant was required for this surgical procedure.  The duties of the assistant included but were not limited to suctioning, passing suture, camera manipulation, retraction. This procedure would not be able to be performed without an Environmental consultant.  Resident: Dr. Basilio Cairo  Anesthesia: General  Complications: None  EBL: 150 mL  IVF:  1500 mL crystalloid  Specimens: 1. Prostate and seminal vesicles 2. Right pelvic lymph nodes 3. Left pelvic lymph nodes 4. Posterior apical margin  Disposition of specimens: Pathology  Drains: 1. 20 Fr coude catheter 2. # 19 Blake pelvic drain  Indication: Casey Burke is a 72 y.o. patient with clinically localized prostate cancer.  After a thorough review of the management options for treatment of prostate cancer, he elected to proceed with surgical therapy and the above procedure(s).  We have discussed the potential benefits and risks of the procedure, side effects of the proposed treatment, the likelihood of the patient achieving the goals of the procedure, and any potential problems that might occur during the procedure or recuperation. Informed consent has been obtained.  Description of procedure:  The patient was taken to the operating room and a general anesthetic was administered. He was given preoperative antibiotics, placed in the dorsal lithotomy position, and prepped and draped in the usual sterile fashion. Next a preoperative timeout was performed. A urethral catheter was placed into the bladder and a  site was selected near the umbilicus for placement of the camera port. This was placed using a standard open Hassan technique which allowed entry into the peritoneal cavity under direct vision and without difficulty. An 8 mm port was placed and a pneumoperitoneum established. The camera was then used to inspect the abdomen and there was no evidence of any intra-abdominal injuries or other abnormalities. The remaining abdominal ports were then placed. 8 mm robotic ports were placed in the right lower quadrant, left lower quadrant, and far left lateral abdominal wall. A 5 mm port was placed in the right upper quadrant and a 12 mm port was placed in the right lateral abdominal wall for laparoscopic assistance. All ports were placed under direct vision without difficulty. The surgical cart was then docked.   Utilizing the cautery scissors, the bladder was reflected posteriorly allowing entry into the space of Retzius and identification of the endopelvic fascia and prostate. The periprostatic fat was then removed from the prostate allowing full exposure of the endopelvic fascia. The endopelvic fascia was then incised from the apex back to the base of the prostate bilaterally and the underlying levator muscle fibers were swept laterally off the prostate thereby isolating the dorsal venous complex. The dorsal vein was then stapled and divided with a 45 mm Flex Echelon stapler. Attention then turned to the bladder neck which was divided anteriorly thereby allowing entry into the bladder and exposure of the urethral catheter. The catheter balloon was deflated and the catheter was brought into the operative field and used to retract the prostate anteriorly. The posterior bladder neck was then examined and was divided allowing further dissection between the bladder and prostate posteriorly until the vasa deferentia and seminal vessels were identified. The vasa deferentia were  isolated, divided, and lifted anteriorly. The  seminal vesicles were dissected down to their tips with care to control the seminal vascular arterial blood supply. These structures were then lifted anteriorly and the space between Denonvillier's fascia and the anterior rectum was developed with a combination of sharp and blunt dissection. This isolated the vascular pedicles of the prostate.  The lateral prostatic fascia on the left side of the prostate was then sharply incised allowing release of the neurovascular bundle. The vascular pedicle of the prostate on the left side was then ligated with Weck clips between the prostate and neurovascular bundle and divided with sharp cold scissor dissection resulting in neurovascular bundle preservation. On the right side, a wide non nerve sparing dissection was performed with Weck clips used to ligate the vascular pedicle of the prostate. The neurovascular bundle on the left side was then separated off the apex of the prostate and urethra.  The urethra was then sharply transected allowing the prostate specimen to be disarticulated. There was an area that was suspicious for possible tumor extension on the posterior urethra and this was removed as a separate specimen. The pelvis was copiously irrigated and hemostasis was ensured. There was no evidence for rectal injury.  Attention then turned to the right pelvic sidewall. The fibrofatty tissue between the external iliac vein, confluence of the iliac vessels, hypogastric artery, and Cooper's ligament was dissected free from the pelvic sidewall with care to preserve the obturator nerve. Weck clips were used for lymphostasis and hemostasis. An identical procedure was performed on the contralateral side and the lymphatic packets were removed for permanent pathologic analysis.  Attention then turned to the urethral anastomosis. A 2-0 Vicryl slip knot was placed between Denonvillier's fascia, the posterior bladder neck, and the posterior urethra to reapproximate these  structures. A double-armed 3-0 Monocryl suture was then used to perform a 360 running tension-free anastomosis between the bladder neck and urethra. A new urethral catheter was then placed into the bladder and irrigated. There were no blood clots within the bladder and the anastomosis appeared to be watertight. A #19 Blake drain was then brought through the left lateral 8 mm port site and positioned appropriately within the pelvis. It was secured to the skin with a nylon suture. The surgical cart was then undocked. The right lateral 12 mm port site was closed at the fascial level with a 0 Vicryl suture placed laparoscopically. All remaining ports were then removed under direct vision. The prostate specimen was removed intact within the Endopouch retrieval bag via the periumbilical camera port site. This fascial opening was closed with two running 0 Vicryl sutures. 0.25% Marcaine was then injected into all port sites and all incisions were reapproximated at the skin level with 4-0 Monocryl subcuticular sutures and Dermabond. The patient appeared to tolerate the procedure well and without complications. The patient was able to be extubated and transferred to the recovery unit in satisfactory condition.   Pryor Curia MD

## 2018-02-27 NOTE — Transfer of Care (Signed)
Immediate Anesthesia Transfer of Care Note  Patient: Casey Burke  Procedure(s) Performed: Procedure(s): XI ROBOTIC ASSISTED LAPAROSCOPIC RADICAL PROSTATECTOMY LEVEL 2 (N/A) LYMPHADENECTOMY, PELVIC (Bilateral)  Patient Location: PACU  Anesthesia Type:General  Level of Consciousness:  sedated, patient cooperative and responds to stimulation  Airway & Oxygen Therapy:Patient Spontanous Breathing and Patient connected to face mask oxgen  Post-op Assessment:  Report given to PACU RN and Post -op Vital signs reviewed and stable  Post vital signs:  Reviewed and stable  Last Vitals:  Vitals:   02/27/18 0538  BP: (!) 146/70  Pulse: (!) 58  Resp: 18  Temp: 36.8 C  SpO2: 223%    Complications: No apparent anesthesia complications

## 2018-02-27 NOTE — Care Management Obs Status (Signed)
[facility] NOTIFICATION   Patient Details  Name: Casey Burke MRN: [id number] Date of Birth: 11-Nov-1945   Medicare Observation Status Notification Given:  Yes    MahabirJuliann Pulse, RN 02/27/2018, 3:26 PM

## 2018-02-27 NOTE — Discharge Instructions (Signed)

## 2018-02-27 NOTE — Anesthesia Procedure Notes (Deleted)
Performed by: Anne Fu, CRNA

## 2018-02-27 NOTE — Anesthesia Procedure Notes (Signed)
Procedure Name: Intubation Date/Time: 02/27/2018 7:29 AM Performed by: Anne Fu, CRNA Pre-anesthesia Checklist: Patient identified, Emergency Drugs available, Suction available, Patient being monitored and Timeout performed Patient Re-evaluated:Patient Re-evaluated prior to induction Oxygen Delivery Method: Circle system utilized Preoxygenation: Pre-oxygenation with 100% oxygen Induction Type: IV induction Ventilation: Mask ventilation without difficulty Laryngoscope Size: Mac and 4 Grade View: Grade III Tube type: Oral Tube size: 7.5 mm Number of attempts: 2 Airway Equipment and Method: Stylet,  Video-laryngoscopy and Bougie stylet Placement Confirmation: ETT inserted through vocal cords under direct vision,  positive ETCO2 and breath sounds checked- equal and bilateral Secured at: 23 cm Tube secured with: Tape Dental Injury: Teeth and Oropharynx as per pre-operative assessment  Difficulty Due To: Difficulty was anticipated Comments: Grade III view with MAC 4 X 1 attempt pt with noted triangle shaped upper palate with limited mouth opening.  Unable to place MAC 4 in proper location due to teeth. Converted to glidescope Grade I view X 1 attempt with positive CO2 and bilat. Lung fields noted on ausculation.

## 2018-02-27 NOTE — Anesthesia Preprocedure Evaluation (Signed)
Anesthesia Evaluation  Patient identified by MRN, date of birth, ID band Patient awake    Reviewed: Allergy & Precautions, NPO status , Patient's Chart, lab work & pertinent test results  Airway Mallampati: II  TM Distance: >3 FB Neck ROM: Full    Dental  (+) Dental Advisory Given   Pulmonary neg pulmonary ROS, pneumonia, former smoker,    Pulmonary exam normal breath sounds clear to auscultation       Cardiovascular negative cardio ROS Normal cardiovascular exam Rhythm:Regular Rate:Normal     Neuro/Psych negative neurological ROS  negative psych ROS   GI/Hepatic Neg liver ROS, GERD  Medicated,  Endo/Other  negative endocrine ROS  Renal/GU negative Renal ROS     Musculoskeletal negative musculoskeletal ROS (+) Arthritis ,   Abdominal   Peds  Hematology negative hematology ROS (+)   Anesthesia Other Findings   Reproductive/Obstetrics negative OB ROS                             Anesthesia Physical Anesthesia Plan  ASA: II  Anesthesia Plan: General   Post-op Pain Management:    Induction: Intravenous  PONV Risk Score and Plan: 4 or greater and Ondansetron, Dexamethasone and Treatment may vary due to age or medical condition  Airway Management Planned: Oral ETT  Additional Equipment: None  Intra-op Plan:   Post-operative Plan: Extubation in OR  Informed Consent: I have reviewed the patients History and Physical, chart, labs and discussed the procedure including the risks, benefits and alternatives for the proposed anesthesia with the patient or authorized representative who has indicated his/her understanding and acceptance.   Dental advisory given  Plan Discussed with: CRNA  Anesthesia Plan Comments:         Anesthesia Quick Evaluation

## 2018-02-27 NOTE — Anesthesia Postprocedure Evaluation (Signed)
Anesthesia Post Note  Patient: Casey Burke  Procedure(s) Performed: XI ROBOTIC ASSISTED LAPAROSCOPIC RADICAL PROSTATECTOMY LEVEL 2 (N/A ) LYMPHADENECTOMY, PELVIC (Bilateral )     Patient location during evaluation: PACU Anesthesia Type: General Level of consciousness: sedated and patient cooperative Pain management: pain level controlled Vital Signs Assessment: post-procedure vital signs reviewed and stable Respiratory status: spontaneous breathing Cardiovascular status: stable Anesthetic complications: no    Last Vitals:  Vitals:   02/27/18 1200 02/27/18 1215  BP: (!) 120/58 120/63  Pulse: (!) 51 (!) 57  Resp: 10 15  Temp: 36.5 C (!) 35.6 C  SpO2: 99% 100%    Last Pain:  Vitals:   02/27/18 1215  TempSrc:   PainSc: [place]

## 2018-02-28 ENCOUNTER — Encounter (HOSPITAL_COMMUNITY): Payer: Self-pay | Admitting: Urology

## 2018-02-28 DIAGNOSIS — C61 Malignant neoplasm of prostate: Secondary | ICD-10-CM | POA: Diagnosis not present

## 2018-02-28 LAB — HEMOGLOBIN AND HEMATOCRIT, BLOOD
HCT: 37.3 % — ABNORMAL LOW (ref 39.0–52.0)
Hemoglobin: 12.9 g/dL — ABNORMAL LOW (ref 13.0–17.0)

## 2018-02-28 MED ORDER — TRAMADOL HCL 50 MG PO TABS: 50.0000 mg | ORAL_TABLET | Freq: Four times a day (QID) | ORAL | Status: DC | PRN

## 2018-02-28 MED ORDER — BISACODYL 10 MG RE SUPP
10.0000 mg | Freq: Once | RECTAL | Status: AC
  Administered 2018-02-28: 10 mg via RECTAL
  Filled 2018-02-28: qty 1

## 2018-02-28 NOTE — Progress Notes (Signed)
Patient ID: Casey Burke, male   DOB: 06-24-1946, 72 y.o.   MRN: [id number]  1 Day Post-Op Subjective: The patient is doing well.  No nausea or vomiting. Pain is adequately controlled.  Objective: Vital signs in last 24 hours: Temp:  [96.1 F (35.6 C)-99.2 F (37.3 C)] 99.2 F (37.3 C) (09/06 0506) Pulse Rate:  [48-70] 58 (09/06 0506) Resp:  [6-18] 16 (09/06 0506) BP: (103-138)/(46-81) 127/71 (09/06 0506) SpO2:  [98 %-100 %] 99 % (09/06 0506)  Intake/Output from previous day: 09/05 0701 - 09/06 0700 In: 5055.3 [I.V.:3905.3; IV Piggyback:1150] Out: 2775 [Urine:2400; Drains:225; Blood:150] Intake/Output this shift: Total I/O In: -  Out: 58 [Urine:800; Drains:10]  Physical Exam:  General: Alert and oriented. CV: RRR Lungs: Clear bilaterally. GI: Soft, Nondistended. Incisions: Clean, dry, and intact Urine: Clear Extremities: Nontender, no erythema, no edema.  Lab Results: Recent Labs    02/27/18 1128 02/28/18 0550  HGB 13.2 12.9*  HCT 37.3* 37.3*      Assessment/Plan: POD# 1 s/p robotic prostatectomy.  1) SL IVF 2) Ambulate, Incentive spirometry 3) Transition to oral pain medication 4) Dulcolax suppository 5) D/C pelvic drain 6) Plan for likely discharge later today   Pryor Curia. MD   LOS: 0 days   Geniva Lohnes,LES 02/28/2018, 7:56 AM

## 2018-02-28 NOTE — Progress Notes (Signed)
Pt to be discharged to home this afternoon. Home Care Robotic Prostatectomy reviewed with Pt and Pt's Wife. Pt able to return demonstration of foley to leg bag. Medications reviewed with Pt and Pt's Wife. Understanding verbalized of all discharge teaching and instructions. VSS and no acute changes noted with Pt's assessment at time of disscharge

## 2018-02-28 NOTE — Discharge Summary (Signed)
  Date of admission: 02/27/2018  Date of discharge: 02/28/2018  Admission diagnosis: Prostate Cancer  Discharge diagnosis: Prostate Cancer  History and Physical: For full details, please see admission history and physical. Briefly, Casey Burke is a 72 y.o. gentleman with localized prostate cancer.  After discussing management/treatment options, he elected to proceed with surgical treatment.  Hospital Course: Casey Burke was taken to the operating room on 02/27/2018 and underwent a robotic assisted laparoscopic radical prostatectomy. He tolerated this procedure well and without complications. Postoperatively, he was able to be transferred to a regular hospital room following recovery from anesthesia.  He was able to begin ambulating the night of surgery. He remained hemodynamically stable overnight.  He had excellent urine output with appropriately minimal output from his pelvic drain and his pelvic drain was removed on POD #1.  He was transitioned to oral pain medication, tolerated a clear liquid diet, and had met all discharge criteria and was able to be discharged home later on POD#1.  Laboratory values:  Recent Labs    02/27/18 1128 02/28/18 0550  HGB 13.2 12.9*  HCT 37.3* 37.3*    Disposition: Home  Discharge instruction: He was instructed to be ambulatory but to refrain from heavy lifting, strenuous activity, or driving. He was instructed on urethral catheter care.  Discharge medications:   Allergies as of 02/28/2018   No Known Allergies     Medication List    STOP taking these medications   GLUCOSAMINE PO   ibuprofen 200 MG tablet Commonly known as:  ADVIL,MOTRIN   MULTIVITAMIN PO     TAKE these medications   famotidine 20 MG tablet Commonly known as:  PEPCID Take 40 mg by mouth daily as needed for heartburn or indigestion.   sulfamethoxazole-trimethoprim 800-160 MG tablet Commonly known as:  BACTRIM DS,SEPTRA DS Take 1 tablet by mouth 2 (two) times daily. Start the  day prior to foley removal appointment   traMADol 50 MG tablet Commonly known as:  ULTRAM Take 1-2 tablets (50-100 mg total) by mouth every 6 (six) hours as needed for moderate pain or severe pain.       Followup: He will followup in 1 week for catheter removal and to discuss his surgical pathology results.

## 2018-02-28 NOTE — Progress Notes (Signed)
Urology Progress Note   1 Day Post-Op s/p RALP  Subjective: NAEON. Pain controlled. Prn tramadol effective. Tolerated clears Ambulated multiple times without issue Excellent uop at 2.3L JP 225cc serosang Hgb 12.9 from 13.2  Objective: Vital signs in last 24 hours: Temp:  [96.1 F (35.6 C)-99.2 F (37.3 C)] 99.2 F (37.3 C) (09/06 0506) Pulse Rate:  [48-70] 58 (09/06 0506) Resp:  [6-18] 16 (09/06 0506) BP: (103-138)/(46-81) 127/71 (09/06 0506) SpO2:  [98 %-100 %] 99 % (09/06 0506)  Intake/Output from previous day: 09/05 0701 - 09/06 0700 In: 5055.3 [I.V.:3905.3; IV Piggyback:1150] Out: 2775 [Urine:2400; Drains:225; Blood:150] Intake/Output this shift: Total I/O In: 2055.3 [I.V.:2005.3; IV Piggyback:50] Out: 1985 [ZJQBH:4193; Drains:110]  Physical Exam:  General: Alert and oriented CV: RRR Lungs: Clear Abdomen: Soft, appropriately tender. Port incisions clean dry and intact as well as midline extraction. JP serosang. GU: Foley in place draining clear yellow urine, pink tinged  Ext: NT, No erythema  Lab Results: Recent Labs    02/27/18 1128 02/28/18 0550  HGB 13.2 12.9*  HCT 37.3* 37.3*   BMET No results for input(s): NA, K, CL, CO2, GLUCOSE, BUN, CREATININE, CALCIUM in the last 72 hours.   Studies/Results: No results found.  Assessment/Plan:  72 y.o. male POD1 s/p robotic prostatectomy + BPLND.  1) medlock IVF 2) Ambulate, Incentive spirometry 3) Transition to oral pain medication 4) D/C pelvic drain 5) Plan for likely discharge later today  Dispo: anticipate discharge to home. Will need foley bag education prior   LOS: 0 days   Fredricka Bonine 02/28/2018, 6:58 AM

## 2019-07-24 ENCOUNTER — Ambulatory Visit: Payer: Medicare Other

## 2019-08-01 ENCOUNTER — Ambulatory Visit: Payer: Medicare Other

## 2019-08-04 ENCOUNTER — Ambulatory Visit: Payer: Medicare Other

## 2019-12-30 DIAGNOSIS — C61 Malignant neoplasm of prostate: Secondary | ICD-10-CM | POA: Diagnosis not present

## 2019-12-30 DIAGNOSIS — R197 Diarrhea, unspecified: Secondary | ICD-10-CM | POA: Diagnosis not present

## 2020-01-13 DIAGNOSIS — N5201 Erectile dysfunction due to arterial insufficiency: Secondary | ICD-10-CM | POA: Diagnosis not present

## 2020-01-13 DIAGNOSIS — C61 Malignant neoplasm of prostate: Secondary | ICD-10-CM | POA: Diagnosis not present

## 2020-04-15 DIAGNOSIS — D225 Melanocytic nevi of trunk: Secondary | ICD-10-CM | POA: Diagnosis not present

## 2020-04-15 DIAGNOSIS — L821 Other seborrheic keratosis: Secondary | ICD-10-CM | POA: Diagnosis not present

## 2020-04-15 DIAGNOSIS — L570 Actinic keratosis: Secondary | ICD-10-CM | POA: Diagnosis not present

## 2020-04-15 DIAGNOSIS — L578 Other skin changes due to chronic exposure to nonionizing radiation: Secondary | ICD-10-CM | POA: Diagnosis not present

## 2020-06-01 DIAGNOSIS — Z20822 Contact with and (suspected) exposure to covid-19: Secondary | ICD-10-CM | POA: Diagnosis not present

## 2020-07-06 DIAGNOSIS — C61 Malignant neoplasm of prostate: Secondary | ICD-10-CM | POA: Diagnosis not present

## 2020-07-13 DIAGNOSIS — N5201 Erectile dysfunction due to arterial insufficiency: Secondary | ICD-10-CM | POA: Diagnosis not present

## 2020-07-13 DIAGNOSIS — Z8546 Personal history of malignant neoplasm of prostate: Secondary | ICD-10-CM | POA: Diagnosis not present

## 2020-09-07 DIAGNOSIS — M720 Palmar fascial fibromatosis [Dupuytren]: Secondary | ICD-10-CM | POA: Diagnosis not present

## 2020-09-07 DIAGNOSIS — M24562 Contracture, left knee: Secondary | ICD-10-CM | POA: Diagnosis not present

## 2020-09-07 DIAGNOSIS — Z0000 Encounter for general adult medical examination without abnormal findings: Secondary | ICD-10-CM | POA: Diagnosis not present

## 2020-09-07 DIAGNOSIS — Z79899 Other long term (current) drug therapy: Secondary | ICD-10-CM | POA: Diagnosis not present

## 2020-09-07 DIAGNOSIS — E7800 Pure hypercholesterolemia, unspecified: Secondary | ICD-10-CM | POA: Diagnosis not present

## 2020-12-05 DIAGNOSIS — M720 Palmar fascial fibromatosis [Dupuytren]: Secondary | ICD-10-CM | POA: Diagnosis not present

## 2021-01-06 DIAGNOSIS — H0102A Squamous blepharitis right eye, upper and lower eyelids: Secondary | ICD-10-CM | POA: Diagnosis not present

## 2021-01-06 DIAGNOSIS — H0102B Squamous blepharitis left eye, upper and lower eyelids: Secondary | ICD-10-CM | POA: Diagnosis not present

## 2021-01-06 DIAGNOSIS — H2513 Age-related nuclear cataract, bilateral: Secondary | ICD-10-CM | POA: Diagnosis not present

## 2021-02-06 DIAGNOSIS — M720 Palmar fascial fibromatosis [Dupuytren]: Secondary | ICD-10-CM | POA: Diagnosis not present

## 2021-02-08 DIAGNOSIS — M720 Palmar fascial fibromatosis [Dupuytren]: Secondary | ICD-10-CM | POA: Diagnosis not present

## 2021-02-08 DIAGNOSIS — M25641 Stiffness of right hand, not elsewhere classified: Secondary | ICD-10-CM | POA: Diagnosis not present

## 2021-02-16 DIAGNOSIS — M25641 Stiffness of right hand, not elsewhere classified: Secondary | ICD-10-CM | POA: Diagnosis not present

## 2021-04-19 DIAGNOSIS — L570 Actinic keratosis: Secondary | ICD-10-CM | POA: Diagnosis not present

## 2021-04-19 DIAGNOSIS — D225 Melanocytic nevi of trunk: Secondary | ICD-10-CM | POA: Diagnosis not present

## 2021-04-19 DIAGNOSIS — Z23 Encounter for immunization: Secondary | ICD-10-CM | POA: Diagnosis not present

## 2021-04-19 DIAGNOSIS — L821 Other seborrheic keratosis: Secondary | ICD-10-CM | POA: Diagnosis not present

## 2021-04-19 DIAGNOSIS — L578 Other skin changes due to chronic exposure to nonionizing radiation: Secondary | ICD-10-CM | POA: Diagnosis not present

## 2021-04-19 DIAGNOSIS — L309 Dermatitis, unspecified: Secondary | ICD-10-CM | POA: Diagnosis not present

## 2021-07-27 DIAGNOSIS — Z8546 Personal history of malignant neoplasm of prostate: Secondary | ICD-10-CM | POA: Diagnosis not present

## 2021-08-03 DIAGNOSIS — Z8546 Personal history of malignant neoplasm of prostate: Secondary | ICD-10-CM | POA: Diagnosis not present

## 2021-08-03 DIAGNOSIS — N5201 Erectile dysfunction due to arterial insufficiency: Secondary | ICD-10-CM | POA: Diagnosis not present

## 2021-09-12 DIAGNOSIS — E7800 Pure hypercholesterolemia, unspecified: Secondary | ICD-10-CM | POA: Diagnosis not present

## 2021-09-12 DIAGNOSIS — Z79899 Other long term (current) drug therapy: Secondary | ICD-10-CM | POA: Diagnosis not present

## 2021-09-15 DIAGNOSIS — H6123 Impacted cerumen, bilateral: Secondary | ICD-10-CM | POA: Diagnosis not present

## 2021-09-15 DIAGNOSIS — M170 Bilateral primary osteoarthritis of knee: Secondary | ICD-10-CM | POA: Diagnosis not present

## 2021-09-15 DIAGNOSIS — Z0000 Encounter for general adult medical examination without abnormal findings: Secondary | ICD-10-CM | POA: Diagnosis not present

## 2022-02-07 DIAGNOSIS — H40013 Open angle with borderline findings, low risk, bilateral: Secondary | ICD-10-CM | POA: Diagnosis not present

## 2022-02-07 DIAGNOSIS — H2513 Age-related nuclear cataract, bilateral: Secondary | ICD-10-CM | POA: Diagnosis not present

## 2022-04-03 DIAGNOSIS — H47293 Other optic atrophy, bilateral: Secondary | ICD-10-CM | POA: Diagnosis not present

## 2022-04-12 DIAGNOSIS — L570 Actinic keratosis: Secondary | ICD-10-CM | POA: Diagnosis not present

## 2022-04-12 DIAGNOSIS — L309 Dermatitis, unspecified: Secondary | ICD-10-CM | POA: Diagnosis not present

## 2022-05-10 DIAGNOSIS — L309 Dermatitis, unspecified: Secondary | ICD-10-CM | POA: Diagnosis not present

## 2022-05-10 DIAGNOSIS — L821 Other seborrheic keratosis: Secondary | ICD-10-CM | POA: Diagnosis not present

## 2022-05-10 DIAGNOSIS — L578 Other skin changes due to chronic exposure to nonionizing radiation: Secondary | ICD-10-CM | POA: Diagnosis not present

## 2022-05-10 DIAGNOSIS — C44311 Basal cell carcinoma of skin of nose: Secondary | ICD-10-CM | POA: Diagnosis not present

## 2022-05-10 DIAGNOSIS — L570 Actinic keratosis: Secondary | ICD-10-CM | POA: Diagnosis not present

## 2022-05-10 DIAGNOSIS — D485 Neoplasm of uncertain behavior of skin: Secondary | ICD-10-CM | POA: Diagnosis not present

## 2022-05-10 DIAGNOSIS — D225 Melanocytic nevi of trunk: Secondary | ICD-10-CM | POA: Diagnosis not present

## 2022-05-14 DIAGNOSIS — H47213 Primary optic atrophy, bilateral: Secondary | ICD-10-CM | POA: Diagnosis not present

## 2022-05-14 DIAGNOSIS — H2513 Age-related nuclear cataract, bilateral: Secondary | ICD-10-CM | POA: Diagnosis not present

## 2022-05-30 DIAGNOSIS — C44311 Basal cell carcinoma of skin of nose: Secondary | ICD-10-CM | POA: Diagnosis not present

## 2022-06-11 DIAGNOSIS — H47093 Other disorders of optic nerve, not elsewhere classified, bilateral: Secondary | ICD-10-CM | POA: Diagnosis not present

## 2022-06-28 DIAGNOSIS — H2513 Age-related nuclear cataract, bilateral: Secondary | ICD-10-CM | POA: Diagnosis not present

## 2022-06-28 DIAGNOSIS — H47213 Primary optic atrophy, bilateral: Secondary | ICD-10-CM | POA: Diagnosis not present

## 2022-07-11 DIAGNOSIS — Z85828 Personal history of other malignant neoplasm of skin: Secondary | ICD-10-CM | POA: Diagnosis not present

## 2022-07-11 DIAGNOSIS — Z5189 Encounter for other specified aftercare: Secondary | ICD-10-CM | POA: Diagnosis not present

## 2022-09-24 DIAGNOSIS — M170 Bilateral primary osteoarthritis of knee: Secondary | ICD-10-CM | POA: Diagnosis not present

## 2022-09-24 DIAGNOSIS — E7800 Pure hypercholesterolemia, unspecified: Secondary | ICD-10-CM | POA: Diagnosis not present

## 2022-09-24 DIAGNOSIS — Z0000 Encounter for general adult medical examination without abnormal findings: Secondary | ICD-10-CM | POA: Diagnosis not present

## 2022-09-24 DIAGNOSIS — Z79899 Other long term (current) drug therapy: Secondary | ICD-10-CM | POA: Diagnosis not present

## 2022-09-24 DIAGNOSIS — R030 Elevated blood-pressure reading, without diagnosis of hypertension: Secondary | ICD-10-CM | POA: Diagnosis not present

## 2022-10-05 DIAGNOSIS — C61 Malignant neoplasm of prostate: Secondary | ICD-10-CM | POA: Diagnosis not present

## 2022-10-12 DIAGNOSIS — Z8546 Personal history of malignant neoplasm of prostate: Secondary | ICD-10-CM | POA: Diagnosis not present

## 2022-10-12 DIAGNOSIS — N5201 Erectile dysfunction due to arterial insufficiency: Secondary | ICD-10-CM | POA: Diagnosis not present

## 2023-02-21 DIAGNOSIS — Z681 Body mass index (BMI) 19 or less, adult: Secondary | ICD-10-CM | POA: Diagnosis not present

## 2023-02-21 DIAGNOSIS — R197 Diarrhea, unspecified: Secondary | ICD-10-CM | POA: Diagnosis not present

## 2023-05-17 DIAGNOSIS — D225 Melanocytic nevi of trunk: Secondary | ICD-10-CM | POA: Diagnosis not present

## 2023-05-17 DIAGNOSIS — L821 Other seborrheic keratosis: Secondary | ICD-10-CM | POA: Diagnosis not present

## 2023-05-17 DIAGNOSIS — L570 Actinic keratosis: Secondary | ICD-10-CM | POA: Diagnosis not present

## 2023-05-17 DIAGNOSIS — D485 Neoplasm of uncertain behavior of skin: Secondary | ICD-10-CM | POA: Diagnosis not present

## 2023-05-17 DIAGNOSIS — L578 Other skin changes due to chronic exposure to nonionizing radiation: Secondary | ICD-10-CM | POA: Diagnosis not present

## 2023-07-08 DIAGNOSIS — H2513 Age-related nuclear cataract, bilateral: Secondary | ICD-10-CM | POA: Diagnosis not present

## 2023-07-08 DIAGNOSIS — H47213 Primary optic atrophy, bilateral: Secondary | ICD-10-CM | POA: Diagnosis not present

## 2023-10-09 DIAGNOSIS — Z0000 Encounter for general adult medical examination without abnormal findings: Secondary | ICD-10-CM | POA: Diagnosis not present

## 2023-10-09 DIAGNOSIS — M179 Osteoarthritis of knee, unspecified: Secondary | ICD-10-CM | POA: Diagnosis not present

## 2023-10-09 DIAGNOSIS — Z23 Encounter for immunization: Secondary | ICD-10-CM | POA: Diagnosis not present

## 2023-10-09 DIAGNOSIS — Z79899 Other long term (current) drug therapy: Secondary | ICD-10-CM | POA: Diagnosis not present

## 2023-10-09 DIAGNOSIS — M720 Palmar fascial fibromatosis [Dupuytren]: Secondary | ICD-10-CM | POA: Diagnosis not present

## 2023-10-09 DIAGNOSIS — E7800 Pure hypercholesterolemia, unspecified: Secondary | ICD-10-CM | POA: Diagnosis not present

## 2023-10-09 DIAGNOSIS — Z681 Body mass index (BMI) 19 or less, adult: Secondary | ICD-10-CM | POA: Diagnosis not present

## 2023-10-25 DIAGNOSIS — Z86018 Personal history of other benign neoplasm: Secondary | ICD-10-CM | POA: Diagnosis not present

## 2023-10-25 DIAGNOSIS — Z1211 Encounter for screening for malignant neoplasm of colon: Secondary | ICD-10-CM | POA: Diagnosis not present

## 2023-11-19 DIAGNOSIS — Z09 Encounter for follow-up examination after completed treatment for conditions other than malignant neoplasm: Secondary | ICD-10-CM | POA: Diagnosis not present

## 2023-11-19 DIAGNOSIS — Z860101 Personal history of adenomatous and serrated colon polyps: Secondary | ICD-10-CM | POA: Diagnosis not present

## 2023-11-19 DIAGNOSIS — Z9079 Acquired absence of other genital organ(s): Secondary | ICD-10-CM | POA: Diagnosis not present

## 2024-03-02 DIAGNOSIS — Z8546 Personal history of malignant neoplasm of prostate: Secondary | ICD-10-CM | POA: Diagnosis not present

## 2024-03-02 DIAGNOSIS — C61 Malignant neoplasm of prostate: Secondary | ICD-10-CM | POA: Diagnosis not present

## 2024-03-02 DIAGNOSIS — N5201 Erectile dysfunction due to arterial insufficiency: Secondary | ICD-10-CM | POA: Diagnosis not present
